# Patient Record
Sex: Male | Born: 1965 | Race: White | Hispanic: Yes | Marital: Married | Smoking: Never smoker
Health system: Southern US, Community
[De-identification: ages and names within clinical notes are randomized; demographics above are authoritative.]

## PROBLEM LIST (undated history)

## (undated) DIAGNOSIS — J45909 Unspecified asthma, uncomplicated: Secondary | ICD-10-CM

## (undated) HISTORY — DX: Unspecified asthma, uncomplicated: J45.909

---

## 2002-11-17 ENCOUNTER — Encounter: Payer: Self-pay | Admitting: Emergency Medicine

## 2002-11-17 ENCOUNTER — Emergency Department (HOSPITAL_COMMUNITY): Admission: EM | Admit: 2002-11-17 | Discharge: 2002-11-17 | Payer: Self-pay | Admitting: Emergency Medicine

## 2002-12-21 ENCOUNTER — Encounter: Payer: Self-pay | Admitting: Emergency Medicine

## 2002-12-21 ENCOUNTER — Emergency Department (HOSPITAL_COMMUNITY): Admission: EM | Admit: 2002-12-21 | Discharge: 2002-12-21 | Payer: Self-pay | Admitting: Emergency Medicine

## 2003-12-13 ENCOUNTER — Ambulatory Visit (HOSPITAL_COMMUNITY): Admission: RE | Admit: 2003-12-13 | Discharge: 2003-12-13 | Payer: Self-pay | Admitting: Internal Medicine

## 2004-08-21 ENCOUNTER — Ambulatory Visit: Payer: Self-pay | Admitting: Internal Medicine

## 2004-08-31 ENCOUNTER — Ambulatory Visit: Payer: Self-pay | Admitting: Internal Medicine

## 2004-08-31 ENCOUNTER — Ambulatory Visit: Payer: Self-pay | Admitting: *Deleted

## 2005-01-07 ENCOUNTER — Ambulatory Visit: Payer: Self-pay | Admitting: Nurse Practitioner

## 2005-06-01 ENCOUNTER — Ambulatory Visit: Payer: Self-pay | Admitting: Internal Medicine

## 2005-08-23 ENCOUNTER — Ambulatory Visit: Payer: Self-pay | Admitting: Family Medicine

## 2005-09-17 ENCOUNTER — Ambulatory Visit: Payer: Self-pay | Admitting: Nurse Practitioner

## 2005-12-06 ENCOUNTER — Ambulatory Visit: Payer: Self-pay | Admitting: Internal Medicine

## 2006-05-20 ENCOUNTER — Ambulatory Visit: Payer: Self-pay | Admitting: Family Medicine

## 2006-06-21 ENCOUNTER — Emergency Department (HOSPITAL_COMMUNITY): Admission: EM | Admit: 2006-06-21 | Discharge: 2006-06-21 | Payer: Self-pay | Admitting: Emergency Medicine

## 2006-07-15 ENCOUNTER — Ambulatory Visit: Payer: Self-pay | Admitting: Family Medicine

## 2006-09-02 ENCOUNTER — Ambulatory Visit: Payer: Self-pay | Admitting: Family Medicine

## 2006-10-07 ENCOUNTER — Ambulatory Visit: Payer: Self-pay | Admitting: Family Medicine

## 2006-10-09 ENCOUNTER — Emergency Department (HOSPITAL_COMMUNITY): Admission: EM | Admit: 2006-10-09 | Discharge: 2006-10-09 | Payer: Self-pay | Admitting: Emergency Medicine

## 2007-07-26 ENCOUNTER — Encounter (INDEPENDENT_AMBULATORY_CARE_PROVIDER_SITE_OTHER): Payer: Self-pay | Admitting: *Deleted

## 2007-08-15 ENCOUNTER — Ambulatory Visit: Payer: Self-pay | Admitting: Internal Medicine

## 2007-08-15 LAB — CONVERTED CEMR LAB
ALT: 35 units/L (ref 0–53)
AST: 25 units/L (ref 0–37)
Albumin: 4.5 g/dL (ref 3.5–5.2)
Alkaline Phosphatase: 117 units/L (ref 39–117)
BUN: 8 mg/dL (ref 6–23)
CO2: 25 meq/L (ref 19–32)
Calcium: 9.4 mg/dL (ref 8.4–10.5)
Chloride: 102 meq/L (ref 96–112)
Cholesterol: 216 mg/dL — ABNORMAL HIGH (ref 0–200)
Creatinine, Ser: 0.97 mg/dL (ref 0.40–1.50)
Glucose, Bld: 94 mg/dL (ref 70–99)
HDL: 32 mg/dL — ABNORMAL LOW (ref >39)
Potassium: 3.8 meq/L (ref 3.5–5.3)
Sodium: 139 meq/L (ref 135–145)
Total Bilirubin: 0.6 mg/dL (ref 0.3–1.2)
Total CHOL/HDL Ratio: 6.8
Total Protein: 7.6 g/dL (ref 6.0–8.3)
Triglycerides: 648 mg/dL — ABNORMAL HIGH (ref <150)

## 2009-06-18 ENCOUNTER — Ambulatory Visit: Payer: Self-pay | Admitting: Internal Medicine

## 2012-09-23 ENCOUNTER — Emergency Department (INDEPENDENT_AMBULATORY_CARE_PROVIDER_SITE_OTHER): Payer: BC Managed Care – PPO

## 2012-09-23 ENCOUNTER — Emergency Department (HOSPITAL_COMMUNITY)
Admission: EM | Admit: 2012-09-23 | Discharge: 2012-09-23 | Disposition: A | Discharge status: Home / Self Care | Payer: BC Managed Care – PPO | Source: Home / Self Care | Attending: Emergency Medicine | Admitting: Emergency Medicine

## 2012-09-23 ENCOUNTER — Encounter (HOSPITAL_COMMUNITY): Payer: Self-pay | Admitting: Emergency Medicine

## 2012-09-23 DIAGNOSIS — J45909 Unspecified asthma, uncomplicated: Secondary | ICD-10-CM

## 2012-09-23 MED ORDER — ALBUTEROL SULFATE (5 MG/ML) 0.5% IN NEBU
INHALATION_SOLUTION | RESPIRATORY_TRACT | Status: AC
  Filled 2012-09-23: qty 1

## 2012-09-23 MED ORDER — METHYLPREDNISOLONE SODIUM SUCC 125 MG IJ SOLR
125.0000 mg | Freq: Once | INTRAMUSCULAR | Status: AC
  Administered 2012-09-23: 125 mg via INTRAMUSCULAR

## 2012-09-23 MED ORDER — ALBUTEROL SULFATE (5 MG/ML) 0.5% IN NEBU
5.0000 mg | INHALATION_SOLUTION | Freq: Once | RESPIRATORY_TRACT | Status: AC
  Administered 2012-09-23: 5 mg via RESPIRATORY_TRACT

## 2012-09-23 MED ORDER — PREDNISONE 10 MG PO TABS: ORAL_TABLET | ORAL | Status: AC

## 2012-09-23 MED ORDER — IPRATROPIUM BROMIDE 0.02 % IN SOLN
0.5000 mg | Freq: Once | RESPIRATORY_TRACT | Status: AC
  Administered 2012-09-23: 0.5 mg via RESPIRATORY_TRACT

## 2012-09-23 MED ORDER — BECLOMETHASONE DIPROPIONATE 80 MCG/ACT IN AERS: 2.0000 | INHALATION_SPRAY | Freq: Two times a day (BID) | RESPIRATORY_TRACT | Status: DC

## 2012-09-23 MED ORDER — ALBUTEROL SULFATE (2.5 MG/3ML) 0.083% IN NEBU: 2.5000 mg | INHALATION_SOLUTION | RESPIRATORY_TRACT | Status: AC | PRN

## 2012-09-23 MED ORDER — METHYLPREDNISOLONE SODIUM SUCC 125 MG IJ SOLR
INTRAMUSCULAR | Status: AC
  Filled 2012-09-23: qty 2

## 2012-09-23 NOTE — ED Provider Notes (Addendum)
Chief Complaint  Patient presents with  . URI    History of Present Illness:   Joshua Barton is a 46 year old male who presents today with a one-month history of cough. He speaks English fairly well but is accompanied by his son to ask as an interpreter. He has a history of asthma in the past but has never had any treatment for it. His current cough is nonproductive and he said wheezing, chest tightness, and chest pain when he coughs. He also had some slight nasal congestion rhinorrhea and a sore throat. He denies any fever or chills. He saw a physician on [address] at an urgent care there and was given albuterol and amoxicillin. The amoxicillin made him feel sick and didn't help at all. The albuterol helps for just a few hours, then he feels tight again.  Review of Systems:  Other than noted above, the patient denies any of the following symptoms: Systemic:  No fevers, chills, sweats, weight loss or gain, fatigue, or tiredness. ENT:  No nasal congestion, sneezing, itching, postnasal drip, sinus pressure, headache, sore throat, or hoarseness. Lungs:  No wheezing, shortness of breath, chest tightness or congestion. Heart:  No chest pain, tightness, pressure, PND, orthopnea, or ankle edema. GI:  No indigestion, heartburn, waterbrash, burping, abdominal pain, nausea, or vomiting.  PMFSH:  Past medical history, family history, social history, meds, and allergies were reviewed.  Specifically, there is no history of asthma, allergies, reflux esophagitis or cigarette smoking.   Physical Exam:   Vital signs:  BP 137/88  Pulse 90  Temp 99.8 F (37.7 C) (Oral)  Resp 22  SpO2 98% General:  Alert and oriented.  In no distress.  Skin warm and dry. ENT: TMs and ear canals normal.  Nasal mucosa normal, without drainage.  Pharynx clear without exudate or drainage.  No intraoral lesions. Neck:  No adenopathy, tenderness or mass.  No JVD. Lungs:  No respiratory distress.  Breath sounds clear and equal  bilaterally.  He has scattered expiratory wheezes, no rales or rhonchi. Heart:  Regular rhythm, no gallops or murmers.  No pedal edema. Abdomon:  Soft and nontender.  No organomegaly or mass.  Radiology:  Dg Chest 2 View  09/23/2012  *RADIOLOGY REPORT*  Clinical Data: Nonproductive cough.  CHEST - 2 VIEW  Comparison: Chest x-ray 10/09/2006.  Findings: Lungs appear hyperexpanded with flattening of the hemidiaphragms and increased retrosternal air space and no consolidative airspace disease.  No pleural effusions.  Pulmonary vasculature appears normal.  Cardiomediastinal silhouette is within normal limits.  IMPRESSION: 1.  Mild hyperexpansion of the lungs without other acute findings. Clinical correlation for signs and symptoms of reactive airway disease is recommended.   Original Report Authenticated By: Trudie Reed, M.D.     Date: 09/23/2012  Rate: 93  Rhythm: normal sinus rhythm with sinus arrhythmia.  QRS Axis: normal  Intervals: normal  ST/T Wave abnormalities: normal  Conduction Disutrbances:none  Narrative Interpretation: Normal sinus rhythm with sinus arrhythmia. Normal EKG.  Old EKG Reviewed: none available  Course in Urgent Care Center:  He was given a DuoNeb breathing treatment and Solu-Medrol 125 mg IM. He tolerated these well and after the treatment felt better. His lungs were clear and wheeze free.  Assessment:  The encounter diagnosis was Asthma.  Plan:   1.  The following meds were prescribed:   New Prescriptions   ALBUTEROL (PROVENTIL) (2.5 MG/3ML) 0.083% NEBULIZER SOLUTION    Take 3 mLs (2.5 mg total) by nebulization every 4 (four)  hours as needed for wheezing.   BECLOMETHASONE (QVAR) 80 MCG/ACT INHALER    Inhale 2 puffs into the lungs 2 (two) times daily.   PREDNISONE (DELTASONE) 10 MG TABLET    Take 4 tabs daily for 4 days, 3 tabs daily for 4 days, 2 tabs daily for 4 days, then 1 tab daily for 4 days.   He was given a prescription for a nebulizer since he fell he  got more benefit from this and he did from the MDI inhaler.  2.  The patient was instructed in symptomatic care and handouts were given. I recommended that he followup with her primary care doctor as soon as possible for followup on his asthma. 3.  The patient was told to return if becoming worse in any way, if no better in 3 or 4 days, and given some red flag symptoms that would indicate earlier return.     Reuben Likes, MD 09/23/12 2124  Reuben Likes, MD 09/23/12 [phone]

## 2012-09-23 NOTE — ED Notes (Signed)
Spoke to dr Lorenz Coaster about patient

## 2012-09-23 NOTE — ED Notes (Signed)
MD at bedside. 

## 2012-09-23 NOTE — ED Notes (Signed)
Patient reports a month history of cough, chest soreness, sore throat, nasal stuffiness with lying down.  Patient reports he did go to a clinic in community approx 3 weeks ago, received albuterol and amoxicillin.  Says "did not help and made stomach hurt" so he stopped antibiotic.  Patient has a forceful cough, but not productive.  Breath sounds are decreased, scattered wheezes in lung bases.  Cough makes chest and torso hurt.

## 2012-09-24 ENCOUNTER — Telehealth (HOSPITAL_COMMUNITY): Payer: Self-pay | Admitting: Emergency Medicine

## 2012-09-24 NOTE — ED Notes (Signed)
Patient came in stating his prescriptions were not at the pharmacy.  I called [facility] on [address].  The pharmacist stated the prescriptions were there and ready for pick up.  She apologized for the confusion.  She stated they did not have an adult nebulizer machine.  I explained to patient that prescriptions were ready but he would have to take RX for nebulizer to another pharmacy.  Patient and family expressed understanding and did not have any additional questions or concerns

## 2012-09-24 NOTE — ED Notes (Signed)
Pharmacy called stating patient would not afford Qvar,  Dr Lorenz Coaster reviewed chart and stated there was not a cheaper alternative.  It did not have to get it if he could not afford it.  Pharmacy made aware

## 2015-07-04 ENCOUNTER — Ambulatory Visit (INDEPENDENT_AMBULATORY_CARE_PROVIDER_SITE_OTHER): Payer: BLUE CROSS/BLUE SHIELD | Admitting: Emergency Medicine

## 2015-07-04 ENCOUNTER — Ambulatory Visit (INDEPENDENT_AMBULATORY_CARE_PROVIDER_SITE_OTHER): Payer: BLUE CROSS/BLUE SHIELD

## 2015-07-04 VITALS — BP 120/72 | HR 90 | Temp 98.3°F | Resp 18 | Ht 66.0 in | Wt 166.0 lb

## 2015-07-04 DIAGNOSIS — M549 Dorsalgia, unspecified: Secondary | ICD-10-CM | POA: Insufficient documentation

## 2015-07-04 DIAGNOSIS — J4520 Mild intermittent asthma, uncomplicated: Secondary | ICD-10-CM | POA: Diagnosis not present

## 2015-07-04 DIAGNOSIS — M545 Low back pain, unspecified: Secondary | ICD-10-CM

## 2015-07-04 DIAGNOSIS — M4317 Spondylolisthesis, lumbosacral region: Secondary | ICD-10-CM | POA: Diagnosis not present

## 2015-07-04 DIAGNOSIS — J4530 Mild persistent asthma, uncomplicated: Secondary | ICD-10-CM

## 2015-07-04 DIAGNOSIS — Z0000 Encounter for general adult medical examination without abnormal findings: Secondary | ICD-10-CM | POA: Diagnosis not present

## 2015-07-04 DIAGNOSIS — Z23 Encounter for immunization: Secondary | ICD-10-CM

## 2015-07-04 DIAGNOSIS — M5489 Other dorsalgia: Secondary | ICD-10-CM

## 2015-07-04 DIAGNOSIS — J45909 Unspecified asthma, uncomplicated: Secondary | ICD-10-CM | POA: Insufficient documentation

## 2015-07-04 LAB — COMPLETE METABOLIC PANEL WITH GFR
ALT: 26 U/L (ref 9–46)
AST: 25 U/L (ref 10–40)
Albumin: 4.5 g/dL (ref 3.6–5.1)
Alkaline Phosphatase: 111 U/L (ref 40–115)
BILIRUBIN TOTAL: 1.2 mg/dL (ref 0.2–1.2)
BUN: 13 mg/dL (ref 7–25)
CALCIUM: 9.5 mg/dL (ref 8.6–10.3)
CO2: 25 mmol/L (ref 20–31)
CREATININE: 1.04 mg/dL (ref 0.60–1.35)
Chloride: 101 mmol/L (ref 98–110)
GFR, Est Non African American: 84 mL/min (ref >=60)
Glucose, Bld: 92 mg/dL (ref 65–99)
Potassium: 4.2 mmol/L (ref 3.5–5.3)
Sodium: 138 mmol/L (ref 135–146)
TOTAL PROTEIN: 7.5 g/dL (ref 6.1–8.1)

## 2015-07-04 LAB — POCT URINALYSIS DIPSTICK
BILIRUBIN UA: NEGATIVE
GLUCOSE UA: NEGATIVE
Leukocytes, UA: NEGATIVE
NITRITE UA: NEGATIVE
Protein, UA: NEGATIVE
RBC UA: NEGATIVE
Spec Grav, UA: 1.025
Urobilinogen, UA: 0.2
pH, UA: 5.5

## 2015-07-04 LAB — POCT CBC
Granulocyte percent: 72 %G (ref 37–80)
HCT, POC: 49 % (ref 43.5–53.7)
Hemoglobin: 16.2 g/dL (ref 14.1–18.1)
LYMPH, POC: 1.3 (ref 0.6–3.4)
MCH, POC: 27.8 pg (ref 27–31.2)
MCHC: 33.1 g/dL (ref 31.8–35.4)
MCV: 84.2 fL (ref 80–97)
MID (CBC): 0.3 (ref 0–0.9)
MPV: 7.7 fL (ref 0–99.8)
POC Granulocyte: 4.3 (ref 2–6.9)
POC LYMPH %: 22.2 % (ref 10–50)
POC MID %: 5.8 % (ref 0–12)
Platelet Count, POC: 260 10*3/uL (ref 142–424)
RBC: 5.83 M/uL (ref 4.69–6.13)
RDW, POC: 13.2 %
WBC: 6 10*3/uL (ref 4.6–10.2)

## 2015-07-04 LAB — POCT UA - MICROSCOPIC ONLY
BACTERIA, U MICROSCOPIC: NEGATIVE
CASTS, UR, LPF, POC: NEGATIVE
CRYSTALS, UR, HPF, POC: NEGATIVE
Epithelial cells, urine per micros: NEGATIVE
MUCUS UA: POSITIVE
RBC, URINE, MICROSCOPIC: NEGATIVE
WBC, Ur, HPF, POC: NEGATIVE
Yeast, UA: NEGATIVE

## 2015-07-04 LAB — LIPID PANEL
CHOLESTEROL: 252 mg/dL — AB (ref 125–200)
HDL: 37 mg/dL — ABNORMAL LOW (ref >=40)
LDL CALC: 139 mg/dL — AB (ref <130)
TRIGLYCERIDES: 382 mg/dL — AB (ref <150)
Total CHOL/HDL Ratio: 6.8 Ratio — ABNORMAL HIGH (ref <=5.0)
VLDL: 76 mg/dL — ABNORMAL HIGH (ref <30)

## 2015-07-04 LAB — PULMONARY FUNCTION TEST

## 2015-07-04 MED ORDER — METHOCARBAMOL 500 MG PO TABS: 500.0000 mg | ORAL_TABLET | Freq: Four times a day (QID) | ORAL | Status: AC

## 2015-07-04 MED ORDER — ALBUTEROL SULFATE HFA 108 (90 BASE) MCG/ACT IN AERS: 1.0000 | INHALATION_SPRAY | Freq: Four times a day (QID) | RESPIRATORY_TRACT | Status: DC | PRN

## 2015-07-04 MED ORDER — METHOCARBAMOL 500 MG PO TABS: 500.0000 mg | ORAL_TABLET | Freq: Four times a day (QID) | ORAL | Status: DC

## 2015-07-04 NOTE — Patient Instructions (Signed)
Dolor de espalda en el adulto °(Back Pain, Adult) ° El dolor de cintura es frecuente. Aproximadamente 1 de cada 5 personas lo sufren. La causa rara vez pone en peligro la vida. Con frecuencia mejora luego de algún tiempo. Alrededor de la mitad de las personas que sufren un inicio súbito de dolor de cintura, se sentirán mejor luego de 2 semanas. Aproximadamente 8 de cada 10 se sentirán mejor luego de 6 semanas.  °CAUSAS  °Algunas causas comunes son:  °· Distensión de los músculos o ligamentos que sostienen la columna vertebral. °· Desgaste (degeneración) de los discos vertebrales. °· Artritis. °· Traumatismos directos en la espalda. °DIAGNÓSTICO  °La mayor parte de las veces, la causa directa no se conoce. Sin embargo, el dolor puede tratarse efectivamente aún cuando no se conozca la causa. Una de las formas más precisas de asegurar que la causa del dolor no constituye un peligro es responder a las preguntas del médico acerca de su salud y sus síntomas. Si el médico necesita más información, podrá indicar análisis de laboratorio o realizar un diagnóstico por imágenes (radiografías o resonancia magnética). Sin embargo, aunque las imágenes muestren modificaciones, generalmente no es necesaria la cirugía.  °INSTRUCCIONES PARA EL CUIDADO EN EL HOGAR  °En algunas personas, el dolor de espalda vuelve. Como rara vez es peligroso, los pacientes pueden aprender a manejarlo ellos mismos.  °· Manténgase activo. Si permanece sentado o de pie mucho tiempo en el mismo lugar, se tensiona la espalda.  No se siente, maneje ni se quede parado en un mismo lugar por más de 30 minutos. Realice caminatas cortas en superficies planas ni bien el dolor haya cedido. Trate de aumentar cada día el tiempo que camina . °· No se quede en la cama. Si hace reposo durante más de 1 o 2 días, puede retrasar la recuperación. °· No evite los ejercicios ni el trabajo. El cuerpo está hecho para moverse. No es peligroso estar activo, aunque le duela la  espalda. La espalda se curará más rápido si continúa sus actividades antes de que el dolor se vaya. °· Preste atención a su cuerpo cuando se incline y se levante. Muchas personas sienten menos molestias cuando levantan objetos si doblan las rodillas, mantienen la carga cerca del cuerpo y evitan torcerse. Generalmente, las posiciones más cómodas son las que ejercen menos tensión en la espalda en recuperación. °· Encuentre una posición cómoda para dormir. Utilice un colchón firme y recuéstese de costado. Doble ligeramente sus rodillas. Si se recuesta sobre su espalda, coloque una almohada debajo de sus rodillas. °· Tome sólo medicamentos de venta libre o recetados, según las indicaciones del médico. Los medicamentos de venta libre para calmar el dolor y reducir la inflamación, son los que en general más ayudan. El médico podrá prescribirle relajantes musculares. Estos medicamentos calman el dolor de modo que pueda retornar a sus actividades normales y a realizar ejercicios saludables. °· Aplique hielo sobre la zona lesionada. °¨ Ponga el hielo en una bolsa plástica. °¨ Colóquese una toalla entre la piel y la bolsa de hielo. °¨ Deje la bolsa de hielo durante 15 a 20 minutos 3 a 4 veces por día, durante los primeros 2 ó 3 días. Luego podrá alternar entre calor y hielo para reducir el dolor y los espasmos. °· Consulte a su médico si puede tratar de hacer ejercicios para la espalda y recibir un masaje suave. Pueden ser beneficiosos. °· Evite sentirse ansioso o estresado. El estrés aumenta la tensión muscular y puede empeorar el dolor de espalda. Es importante reconocer cuando está ansioso o estresado y aprender la forma   de controlarlos. El ejercicio es una gran opción. °SOLICITE ATENCIÓN MÉDICA SI:  °· Siente un dolor que no se alivia con reposo o medicamentos. °· El dolor no mejora en 1 semana. °· Desarrolla nuevos síntomas. °· No se siente bien en general. °SOLICITE ATENCIÓN MÉDICA DE INMEDIATO SI: °· Siente un dolor  que se irradia desde la espalda hacia sus piernas. °· Desarrolla nuevos problemas en el intestino o la vejiga. °· Siente debilidad o adormecimiento inusual en sus brazos o piernas. °· Presenta náuseas o vómitos. °· Presenta dolor abdominal. °· Se siente desfalleciente. °Document Released: 10/25/2005 Document Revised: 04/25/2012 °ExitCare® Patient Information ©2015 [employer]. This information is not intended to replace advice given to you by your health care provider. Make sure you discuss any questions you have with your health care provider. ° °
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
## 2015-07-05 ENCOUNTER — Other Ambulatory Visit: Payer: Self-pay | Admitting: Family Medicine

## 2015-07-05 DIAGNOSIS — E7800 Pure hypercholesterolemia, unspecified: Secondary | ICD-10-CM

## 2015-07-05 LAB — HEPATITIS B SURFACE ANTIGEN: HEPATITIS B SURFACE ANTIGEN: NEGATIVE

## 2015-07-05 LAB — HIV ANTIBODY (ROUTINE TESTING W REFLEX): HIV: NONREACTIVE

## 2015-07-05 LAB — HEPATITIS C ANTIBODY: HCV Ab: NEGATIVE

## 2015-07-05 MED ORDER — ATORVASTATIN CALCIUM 40 MG PO TABS: 40.0000 mg | ORAL_TABLET | Freq: Every day | ORAL | Status: AC

## 2015-07-08 ENCOUNTER — Encounter: Payer: Self-pay | Admitting: Family Medicine

## 2016-01-24 ENCOUNTER — Emergency Department (INDEPENDENT_AMBULATORY_CARE_PROVIDER_SITE_OTHER)
Admission: EM | Admit: 2016-01-24 | Discharge: 2016-01-24 | Disposition: A | Discharge status: Home / Self Care | Payer: BLUE CROSS/BLUE SHIELD | Source: Home / Self Care | Attending: Family Medicine | Admitting: Family Medicine

## 2016-01-24 ENCOUNTER — Encounter (HOSPITAL_COMMUNITY): Payer: Self-pay | Admitting: Emergency Medicine

## 2016-01-24 DIAGNOSIS — M549 Dorsalgia, unspecified: Secondary | ICD-10-CM | POA: Diagnosis not present

## 2016-01-24 DIAGNOSIS — J45901 Unspecified asthma with (acute) exacerbation: Secondary | ICD-10-CM

## 2016-01-24 DIAGNOSIS — J069 Acute upper respiratory infection, unspecified: Secondary | ICD-10-CM | POA: Diagnosis not present

## 2016-01-24 MED ORDER — IPRATROPIUM-ALBUTEROL 0.5-2.5 (3) MG/3ML IN SOLN
RESPIRATORY_TRACT | Status: AC
  Filled 2016-01-24: qty 3

## 2016-01-24 MED ORDER — BECLOMETHASONE DIPROPIONATE 80 MCG/ACT IN AERS: 2.0000 | INHALATION_SPRAY | Freq: Two times a day (BID) | RESPIRATORY_TRACT | Status: AC

## 2016-01-24 MED ORDER — ALBUTEROL SULFATE HFA 108 (90 BASE) MCG/ACT IN AERS: 1.0000 | INHALATION_SPRAY | Freq: Four times a day (QID) | RESPIRATORY_TRACT | Status: AC | PRN

## 2016-01-24 MED ORDER — AZITHROMYCIN 250 MG PO TABS: 250.0000 mg | ORAL_TABLET | Freq: Every day | ORAL | Status: AC

## 2016-01-24 MED ORDER — METHYLPREDNISOLONE SODIUM SUCC 125 MG IJ SOLR
INTRAMUSCULAR | Status: AC
  Filled 2016-01-24: qty 2

## 2016-01-24 MED ORDER — IPRATROPIUM-ALBUTEROL 0.5-2.5 (3) MG/3ML IN SOLN
3.0000 mL | Freq: Once | RESPIRATORY_TRACT | Status: AC
  Administered 2016-01-24: 3 mL via RESPIRATORY_TRACT

## 2016-01-24 MED ORDER — METHYLPREDNISOLONE SODIUM SUCC 125 MG IJ SOLR
80.0000 mg | Freq: Once | INTRAMUSCULAR | Status: AC
  Administered 2016-01-24: 80 mg via INTRAMUSCULAR

## 2016-01-24 NOTE — ED Notes (Signed)
Patient c/o chest pain and upper back pain and shortness of breath since Wednesday. Patient reports she has been using his inhaler and tylenol which gives him temporary relief. Patient speaks Spanish and family member is translating.

## 2016-01-24 NOTE — ED Provider Notes (Signed)
CSN: [id number]     Arrival date & time 01/24/16  1322 History   None    Chief Complaint  Patient presents with  . URI  . Shortness of Breath   (Consider location/radiation/quality/duration/timing/severity/associated sxs/prior Treatment) Patient is a 50 y.o. male presenting with shortness of breath. The history is provided by the patient.  Shortness of Breath Severity:  Moderate (Cough and SOB with chest pain) Onset quality:  Gradual Duration:  3 days Timing:  Intermittent Progression:  Worsening Context: URI and weather changes   Context: not smoke exposure   Context comment:  His daughter was recently sick as well Relieved by: Albuterol helps some but soon after he will starting coughing and having SOB. he uses Tylenol for pain. Worsened by:  Nothing tried Associated symptoms: chest pain, cough, fever, headaches, sore throat and wheezing   Associated symptoms: no ear pain, no sputum production and no vomiting   Associated symptoms comment:  He has some upper back soreness. He endorsed chest tightness. He stated he wheezes mostly at night. He did take his flu shot. He does not use his Qvar because it is expensive. Patient stated the last time he had similar symptoms he was given steroid injection at the urgent care which abated his symptoms.  Past Medical History  Diagnosis Date  . Asthma    History reviewed. No pertinent past surgical history. History reviewed. No pertinent family history. Social History  Substance Use Topics  . Smoking status: Never Smoker   . Smokeless tobacco: None  . Alcohol Use: No     Filed Vitals:   01/24/16 1444  BP: 146/83  Pulse: 68  Temp: 98.7 F (37.1 C)  TempSrc: Oral  Resp: 16  SpO2: 97%    Review of Systems  Constitutional: Positive for fever.  HENT: Positive for sore throat. Negative for ear pain.   Respiratory: Positive for cough, shortness of breath and wheezing. Negative for sputum production.   Cardiovascular: Positive for  chest pain.  Gastrointestinal: Negative for vomiting.  Neurological: Positive for headaches.    Allergies  Asa  Home Medications   Prior to Admission medications   Medication Sig Start Date End Date Taking? Authorizing Provider  albuterol (PROVENTIL HFA;VENTOLIN HFA) 108 (90 BASE) MCG/ACT inhaler Inhale 1-2 puffs into the lungs every 6 (six) hours as needed for wheezing or shortness of breath. 07/04/15  Yes Collene Gobble, MD  albuterol (PROVENTIL) (2.5 MG/3ML) 0.083% nebulizer solution Take 3 mLs (2.5 mg total) by nebulization every 4 (four) hours as needed for wheezing. 09/23/12  Yes Reuben Likes, MD  atorvastatin (LIPITOR) 40 MG tablet Take 1 tablet (40 mg total) by mouth daily. 07/05/15  Yes Collene Gobble, MD  methocarbamol (ROBAXIN) 500 MG tablet Take 1 tablet (500 mg total) by mouth 4 (four) times daily. 07/04/15  Yes Collene Gobble, MD  beclomethasone (QVAR) 80 MCG/ACT inhaler Inhale 2 puffs into the lungs 2 (two) times daily. Patient not taking: Reported on 07/04/2015 09/23/12   Reuben Likes, MD  predniSONE (DELTASONE) 10 MG tablet Take 4 tabs daily for 4 days, 3 tabs daily for 4 days, 2 tabs daily for 4 days, then 1 tab daily for 4 days. Patient not taking: Reported on 07/04/2015 09/23/12   Reuben Likes, MD   Meds Ordered and Administered this Visit  Medications - No data to display  BP 146/83 mmHg  Pulse 68  Temp(Src) 98.7 F (37.1 C) (Oral)  Resp 16  SpO2 97%  No data found.   Physical Exam  Constitutional: He is oriented to person, place, and time. No distress.  HENT:  Head: Normocephalic.  Right Ear: External ear normal.  Left Ear: External ear normal.  Mouth/Throat: Oropharynx is clear and moist. No oropharyngeal exudate.  Eyes: Conjunctivae and EOM are normal. Pupils are equal, round, and reactive to light. Right eye exhibits no discharge. Left eye exhibits no discharge. No scleral icterus.  Neck: Neck supple. No thyromegaly present.  Cardiovascular: Normal  rate, regular rhythm and normal heart sounds.   No murmur heard. Pulmonary/Chest: Effort normal and breath sounds normal. No respiratory distress. He has no wheezes.  Abdominal: Soft. Bowel sounds are normal. He exhibits no distension and no mass. There is no tenderness. There is no rebound.  Musculoskeletal: Normal range of motion. He exhibits no edema.       Cervical back: Normal.       Thoracic back: Normal.       Lumbar back: Normal.  Lymphadenopathy:    He has no cervical adenopathy.  Neurological: He is alert and oriented to person, place, and time. No cranial nerve deficit.  Nursing note and vitals reviewed.   ED Course  Procedures (including critical care time)  Labs Review Labs Reviewed - No data to display  Imaging Review No results found.   Visual Acuity Review  Right Eye Distance:   Left Eye Distance:   Bilateral Distance:    Right Eye Near:   Left Eye Near:    Bilateral Near:         MDM  No diagnosis found. Asthma exacerbation, mild  Upper respiratory infection  Back pain, unspecified location   Patient received Duoneb treatment with Solumdrol 80 mg IM x 1. He felt some relive after treatment. I refilled his albuterol as well as Qvar. I also prescribed Zpak for A/B coverage. May use OTC cough regimen. May use Ibuprofen prn pain. Chest and upper back pain likely due to excessive coughing. Patient reassured. F/U soon if no improvement or if symptoms persist.    Doreene ElandKehinde T Lukis Bunt, MD 01/24/16 1538

## 2016-01-24 NOTE — Discharge Instructions (Signed)
Asma - Adultos (Asthma, Adult) El asma es una enfermedad de los pulmones en la que las vas respiratorias se Investment banker, corporate y [place]. El asma puede causar dificultad para respirar. El asma no puede curarse, pero los [employer] y los cambios en el estilo de vida pueden ayudar a Therapist, sports. Los siguientes factores pueden iniciar (desencadenar) el asma:  Escamas de la piel de los animales (caspa).  Polvo.  Cucarachas.  Polen.  Moho.  Humo.  Productos de limpieza.  Aerosoles para el cabello.  Vapores de pintura u olores fuertes.  Aire fro, cambios climticos y vientos.  Llanto o risa intensos.  Estrs.  Ciertos medicamentos o drogas.  Alimentos, como frutas secas, papas fritas y vinos espumantes.  Infecciones o afecciones (resfros, gripe).  Haga actividad fsica.  Ciertas enfermedades o afecciones.  Ejercicio o actividades extenuantes. CUIDADOS EN EL HOGAR   Tome los Chief Technology Officer.  Use un medidor de flujo espiratorio mximo como le indic su mdico. El medidor de flujo espiratorio mximo es una herramienta que mide el funcionamiento de los pulmones.  Anote y lleve un registro de los valores del medidor de flujo espiratorio mximo.  Conozca el plan de accin para el asma y selo. El plan de accin para el asma es una planificacin por escrito para el control y tratamiento de sus crisis asmticas.  Para prevenir las crisis asmticas:  No fume. Aljese del humo de otros fumadores.  Cambie el filtro de la calefaccin y del aire acondicionado con frecuencia.  Limite el uso de hogares o estufas a lea.  Elimine las plagas (como cucarachas, ratones) y sus excrementos.  Elimine las plantas si observa moho en ellas.  Limpie los pisos. Elimine el polvo regularmente. Use productos de limpieza que sean inoloros.  Pdale a alguien que pase la aspiradora cuando usted no se encuentre en casa. Utilice una aspiradora con filtros HEPA, siempre que  le sea posible.  Reemplace las alfombras por pisos de [place], baldosas o vinilo. Las alfombras pueden retener las escamas de la piel de los animales y [place].  Use almohadas, mantas y cubre colchones antialrgicos.  Lamont sbanas y las mantas todas las semanas con agua caliente y squelas con aire caliente.  Use mantas de polister o algodn.  Limpie baos y cocinas con lavandina. Si fuera posible, pdale a alguien que vuelva a pintar las paredes de estas habitaciones con Ardelia Mems pintura resistente a los hongos. Aljese de las habitaciones que se estn limpiando y pintando.  Lvese las manos con frecuencia. SOLICITE AYUDA SI:  Hace un silbido al respirar (sibilancias), tiene falta de aire o tiene tos incluso despus de tomar los medicamentos para prevenir crisis.  El moco coloreado que elimina (esputo) es ms denso de lo normal.  El moco coloreado que elimina cambia de trasparente o blanco a [place], [place], gris o sanguinolento.  Tiene problemas causados por el medicamento que toma, por ejemplo:  Una erupcin.  Picazn.  Hinchazn.  Problemas respiratorios.  Necesita un medicamento de alivio ms de 2 a 3veces por semana.  El flujo espiratorio mximo an est en 39 a 79% del Pharmacist, hospital personal despus de seguir el plan de accin durante 1hora.  Tiene fiebre. SOLICITE AYUDA DE INMEDIATO SI:   Parece empeorar y no responde al medicamento durante una crisis asmtica.  Le falta el aire, incluso en reposo.  Le falta el aire incluso cuando hace muy poca actividad fsica.  Tiene dificultad para comer, beber o hablar.  Siente  dolor en el pecho.  La frecuencia cardaca est acelerada.  Sus labios o uas comienzan a Environmental education officer.  Usted se siente mareado, dbil o se desmaya.  Su flujo mximo es Garment/textile technologist del 50% del Pharmacist, hospital personal.   Esta informacin no tiene Marine scientist el consejo del mdico. Asegrese de hacerle al mdico cualquier pregunta que  tenga.   Document Released: 01/21/2009 Document Revised: 07/16/2015 Elsevier Interactive Patient Education [employer].

## 2016-06-02 IMAGING — CR DG LUMBAR SPINE COMPLETE 4+V
5 series · 5 of 5 positions shown · non-contrast
Comparison: None in PACs

CLINICAL DATA: One month of low back pain without known injury ;
remote current 8 injury with retained metallic fragments.

EXAM:
LUMBAR SPINE - COMPLETE 4+ VIEW

[AP]
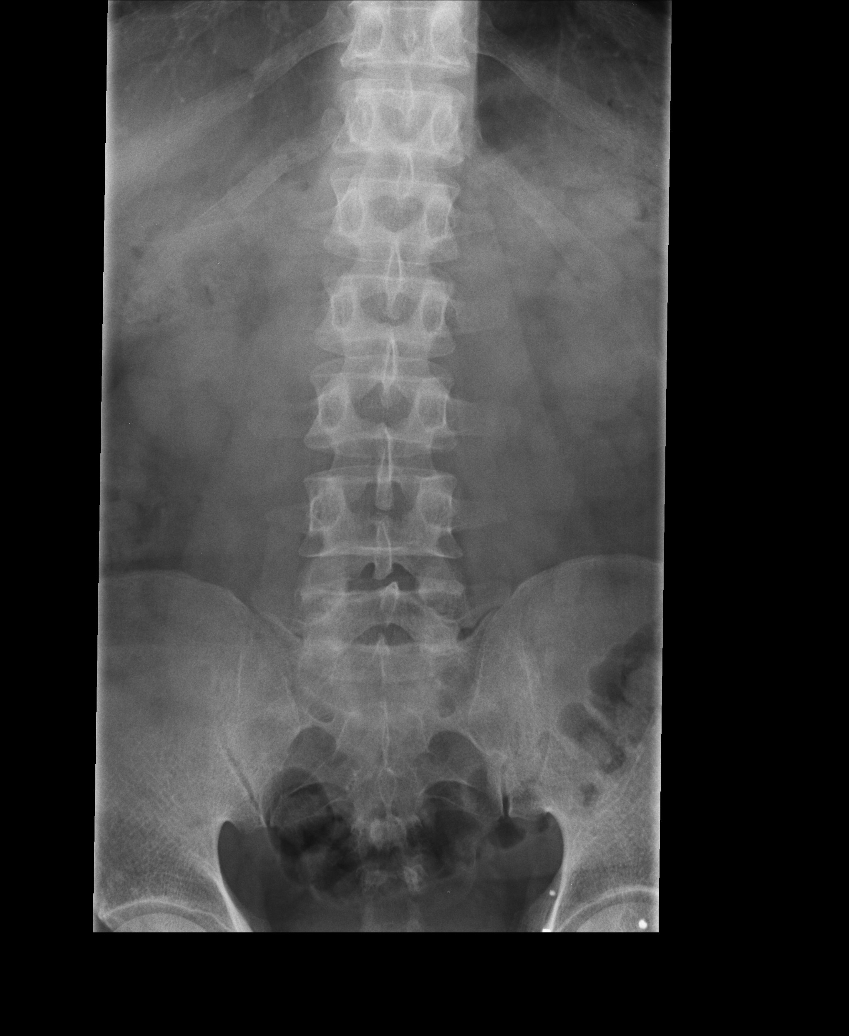

[rpo]
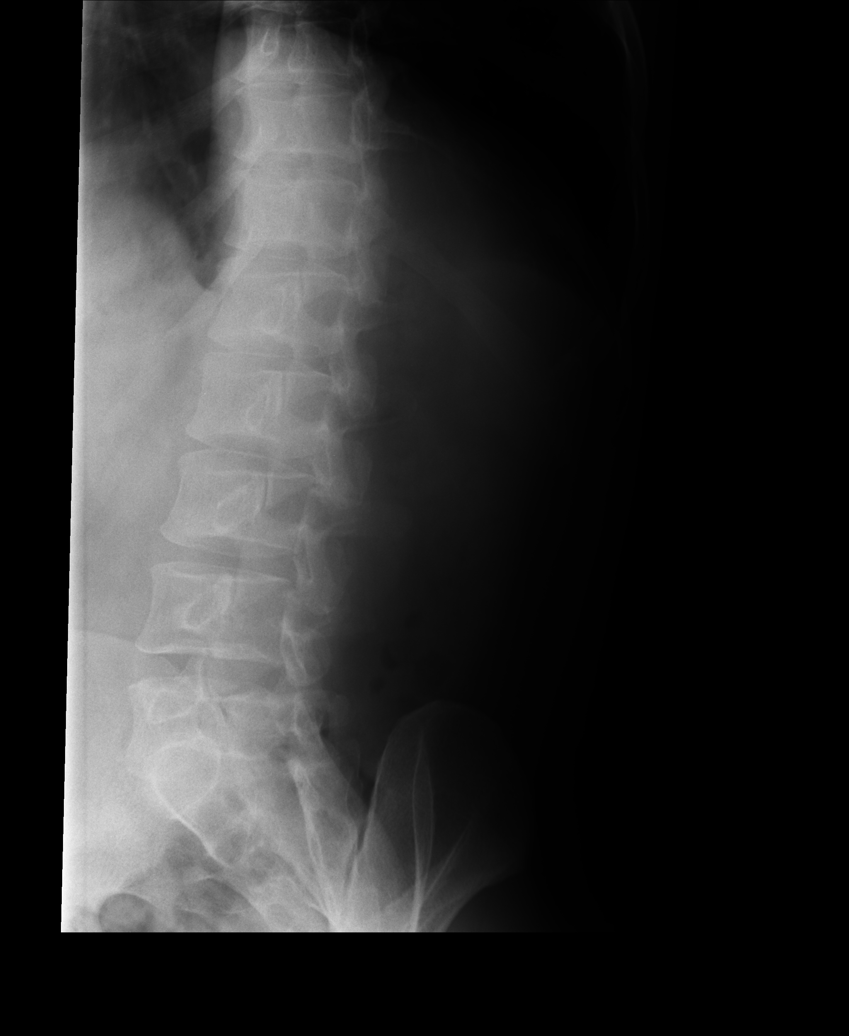

[lpo]
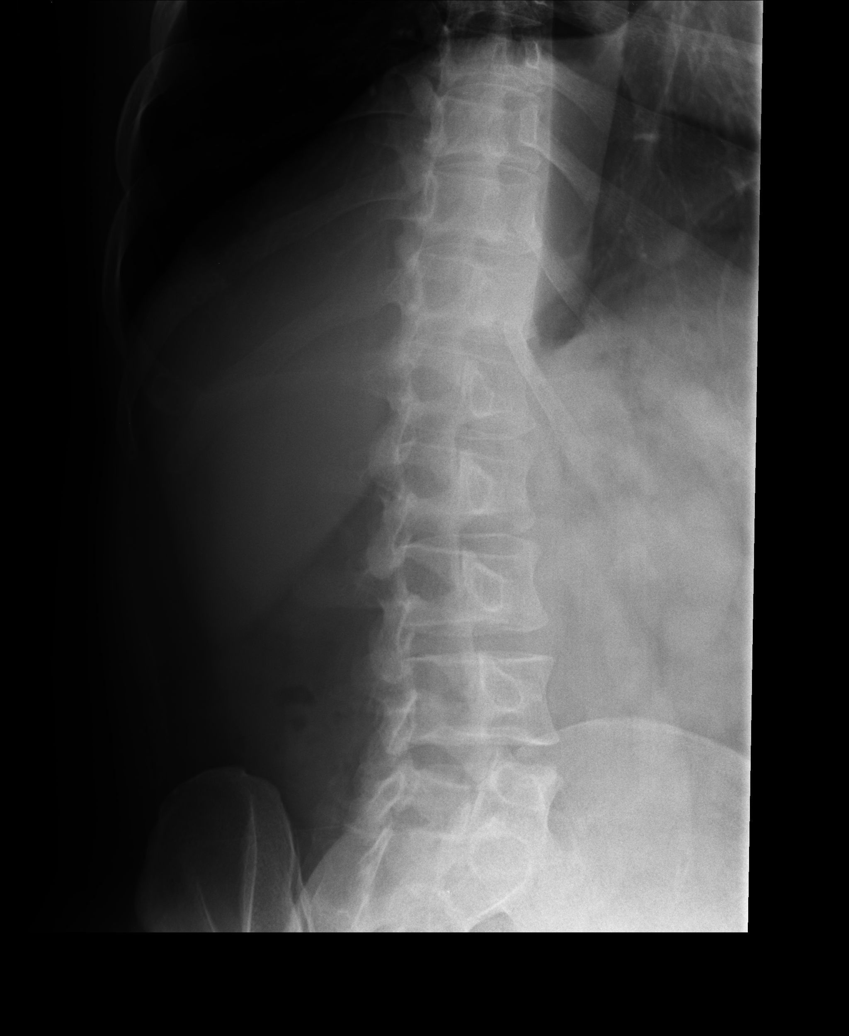

[lateral]
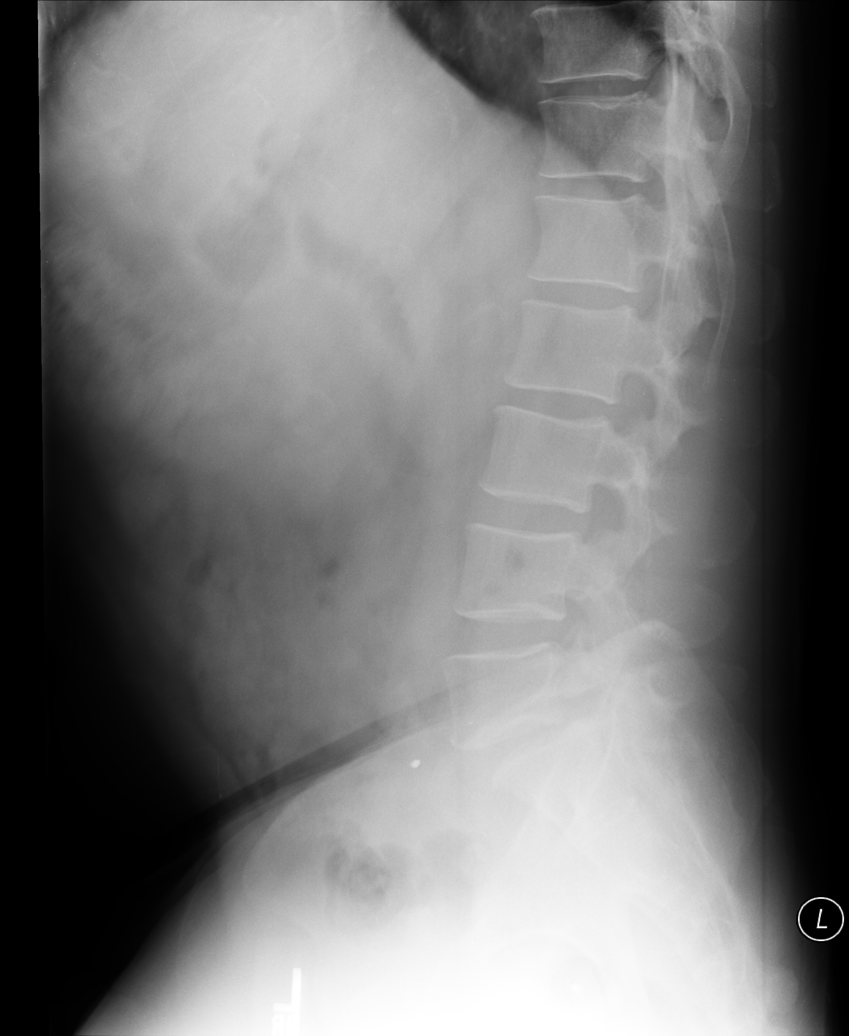

[l5 s1]
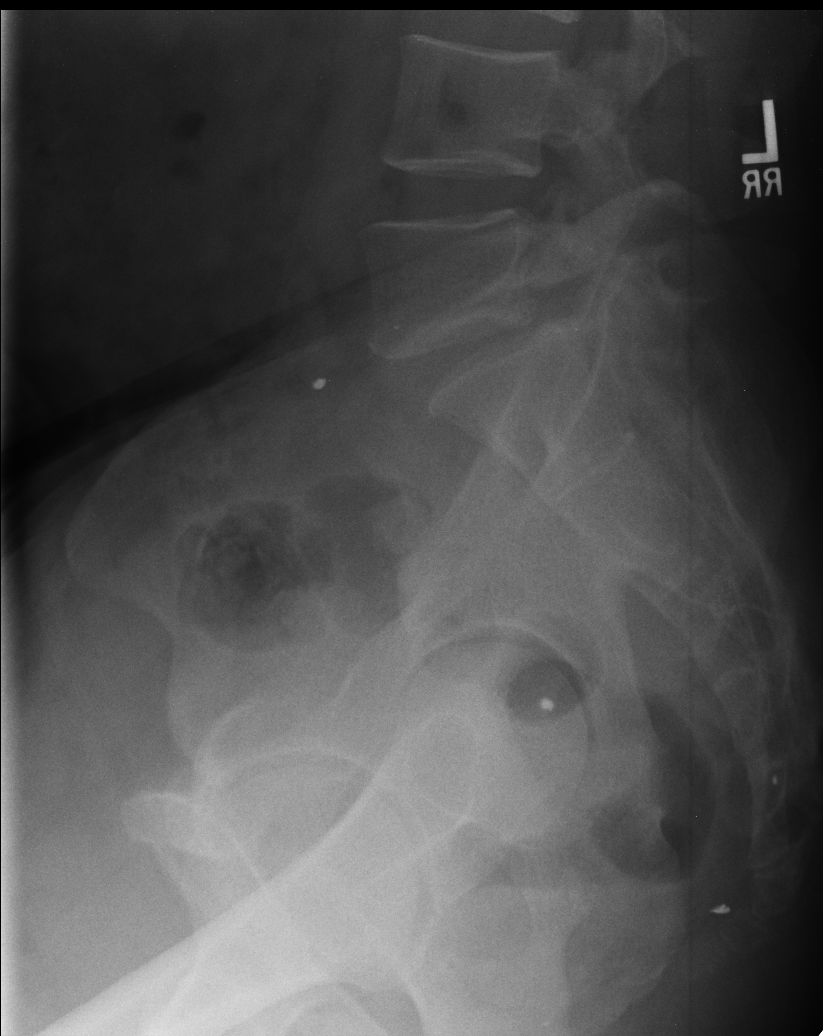

[5 of 5 positions shown; findings below may reference images not displayed]

FINDINGS: The lumbar vertebral bodies are preserved in height. The disc space
heights through L4-5 are well maintained. There is grade 1
anterolisthesis of L5 with respect to S1 likely on the basis of
bilateral pars defects. There is mild degenerative disc space
narrowing at L5-S1. The pedicles and transverse processes are
intact. The observed portions of the sacrum are normal. There are
metallic fragments which project over the left hip.
IMPRESSION: Grade 1 anterolisthesis of L5 with respect S1 secondary to bilateral
pars defects and degenerative disc disease at L5-S1. There is no
compression fracture.
